# Patient Record
Sex: Female | Born: 1990 | Race: White | Hispanic: No | Marital: Single | Smoking: Former smoker
Health system: Southern US, Community
[De-identification: ages and names within clinical notes are randomized; demographics above are authoritative.]

## PROBLEM LIST (undated history)

## (undated) DIAGNOSIS — F32A Depression, unspecified: Secondary | ICD-10-CM

## (undated) DIAGNOSIS — F419 Anxiety disorder, unspecified: Secondary | ICD-10-CM

## (undated) DIAGNOSIS — F329 Major depressive disorder, single episode, unspecified: Secondary | ICD-10-CM

---

## 2015-02-28 ENCOUNTER — Emergency Department (HOSPITAL_COMMUNITY)
Admission: EM | Admit: 2015-02-28 | Discharge: 2015-02-28 | Disposition: A | Discharge status: Home / Self Care | Payer: Worker's Compensation | Attending: Emergency Medicine | Admitting: Emergency Medicine

## 2015-02-28 ENCOUNTER — Encounter (HOSPITAL_COMMUNITY): Payer: Self-pay | Admitting: Family Medicine

## 2015-02-28 DIAGNOSIS — S3991XA Unspecified injury of abdomen, initial encounter: Secondary | ICD-10-CM | POA: Diagnosis not present

## 2015-02-28 DIAGNOSIS — Y998 Other external cause status: Secondary | ICD-10-CM | POA: Insufficient documentation

## 2015-02-28 DIAGNOSIS — W19XXXA Unspecified fall, initial encounter: Secondary | ICD-10-CM

## 2015-02-28 DIAGNOSIS — S4991XA Unspecified injury of right shoulder and upper arm, initial encounter: Secondary | ICD-10-CM | POA: Diagnosis not present

## 2015-02-28 DIAGNOSIS — W1839XA Other fall on same level, initial encounter: Secondary | ICD-10-CM | POA: Diagnosis not present

## 2015-02-28 DIAGNOSIS — Y9289 Other specified places as the place of occurrence of the external cause: Secondary | ICD-10-CM | POA: Diagnosis not present

## 2015-02-28 DIAGNOSIS — R000 Tachycardia, unspecified: Secondary | ICD-10-CM | POA: Insufficient documentation

## 2015-02-28 DIAGNOSIS — Y9389 Activity, other specified: Secondary | ICD-10-CM | POA: Insufficient documentation

## 2015-02-28 DIAGNOSIS — R1012 Left upper quadrant pain: Secondary | ICD-10-CM

## 2015-02-28 DIAGNOSIS — Z87891 Personal history of nicotine dependence: Secondary | ICD-10-CM | POA: Diagnosis not present

## 2015-02-28 DIAGNOSIS — M25511 Pain in right shoulder: Secondary | ICD-10-CM

## 2015-02-28 DIAGNOSIS — M7918 Myalgia, other site: Secondary | ICD-10-CM

## 2015-02-28 LAB — URINALYSIS, ROUTINE W REFLEX MICROSCOPIC
BILIRUBIN URINE: NEGATIVE
Glucose, UA: NEGATIVE mg/dL
Hgb urine dipstick: NEGATIVE
KETONES UR: 15 mg/dL — AB
LEUKOCYTES UA: NEGATIVE
Nitrite: NEGATIVE
Protein, ur: NEGATIVE mg/dL
Specific Gravity, Urine: 1.029 (ref 1.005–1.030)
UROBILINOGEN UA: 0.2 mg/dL (ref 0.0–1.0)
pH: 5.5 (ref 5.0–8.0)

## 2015-02-28 LAB — RAPID URINE DRUG SCREEN, HOSP PERFORMED
Amphetamines: NOT DETECTED
BENZODIAZEPINES: NOT DETECTED
Barbiturates: NOT DETECTED
Cocaine: NOT DETECTED
Opiates: NOT DETECTED
TETRAHYDROCANNABINOL: NOT DETECTED

## 2015-02-28 MED ORDER — KETOROLAC TROMETHAMINE 60 MG/2ML IM SOLN
60.0000 mg | Freq: Once | INTRAMUSCULAR | Status: AC
  Administered 2015-02-28: 60 mg via INTRAMUSCULAR
  Filled 2015-02-28: qty 2

## 2015-02-28 MED ORDER — CYCLOBENZAPRINE HCL 10 MG PO TABS: 10.0000 mg | ORAL_TABLET | Freq: Two times a day (BID) | ORAL | Status: DC | PRN

## 2015-02-28 MED ORDER — ONDANSETRON 4 MG PO TBDP
8.0000 mg | ORAL_TABLET | Freq: Once | ORAL | Status: AC
  Administered 2015-02-28: 8 mg via ORAL
  Filled 2015-02-28: qty 2

## 2015-02-28 MED ORDER — IBUPROFEN 800 MG PO TABS: 800.0000 mg | ORAL_TABLET | Freq: Three times a day (TID) | ORAL | Status: DC

## 2015-02-28 MED ORDER — ONDANSETRON HCL 4 MG PO TABS: 4.0000 mg | ORAL_TABLET | Freq: Three times a day (TID) | ORAL | Status: DC | PRN

## 2015-02-28 NOTE — ED Notes (Signed)
I gave the patient a cup of ice and a ginger-ale. 

## 2015-02-28 NOTE — Discharge Instructions (Signed)
Shoulder Pain  The shoulder is the joint that connects your arm to your body. Muscles and band-like tissues that connect bones to muscles (tendons) hold the joint together. Shoulder pain is felt if an injury or medical problem affects one or more parts of the shoulder.  HOME CARE   · Put ice on the sore area.  ¨ Put ice in a plastic bag.  ¨ Place a towel between your skin and the bag.  ¨ Leave the ice on for 15-20 minutes, 03-04 times a day for the first 2 days.  · Stop using cold packs if they do not help with the pain.  · If you were given something to keep your shoulder from moving (sling; shoulder immobilizer), wear it as told. Only take it off to shower or bathe.  · Move your arm as little as possible, but keep your hand moving to prevent puffiness (swelling).  · Squeeze a soft ball or foam pad as much as possible to help prevent swelling.  · Take medicine as told by your doctor.  GET HELP IF:  · You have progressing new pain in your arm, hand, or fingers.  · Your hand or fingers get cold.  · Your medicine does not help lessen your pain.  GET HELP RIGHT AWAY IF:   · Your arm, hand, or fingers are numb or tingling.  · Your arm, hand, or fingers are puffy (swollen), painful, or turn white or blue.  MAKE SURE YOU:   · Understand these instructions.  · Will watch your condition.  · Will get help right away if you are not doing well or get worse.  Document Released: 01/14/2008 Document Revised: 12/12/2013 Document Reviewed: 02/09/2012  Exit[employer]® Patient Information ©2015 [employer]. This information is not intended to replace advice given to you by your health care provider. Make sure you discuss any questions you have with your health care provider.

## 2015-02-28 NOTE — ED Provider Notes (Signed)
CSN: [id number]     Arrival date & time 02/28/15  1700 History  This chart was scribed for Danelle Berry, PA-C, working with Arby Barrette, MD by Chestine Spore, ED Scribe. The patient was seen in room TR06C/TR06C at 5:39 PM.    Chief Complaint  Patient presents with  . Fall  . Abdominal Pain      The history is provided by the patient. No language interpreter was used.    HPI Comments: Belinda Solis is a 24 y.o. female who presents to the Emergency Department complaining of fall onset 3:20 PM. Pt reports that she was a work and attempted to sit in a chair when the chair collapsed, pt reports that the chair was already broken when she attempted to sit down. Pt notes that she caught her herself by leaning forward and twisting her upper portion of her body. Pt denies hitting her abdomen at the time, but that she gasped and sucked in her abdomen and strained a muscle. Pt went to her pain management doctor who she sees for her ruptured discs who informed her to come to the ED. Pt took her percocet about 5 minutes before the incident occurred. Pt stopped taking her stimulant medicines for a week because her HR was racing. Pt rates her abdomen pain as 8/10 and nothing particularly worsens her right shoulder pain. She states that she is having associated symptoms of severe left upper abdominal pain, nausea due to pain, and right shoulder pain. She denies vomiting, LOC, hitting her head, neck pain, neck stiffness, diarrhea, SOB, CP, lightheadedness, numbness, tingling, and any other symptoms.     History reviewed. No pertinent past medical history. History reviewed. No pertinent past surgical history. History reviewed. No pertinent family history. History  Substance Use Topics  . Smoking status: Former Games developer  . Smokeless tobacco: Not on file  . Alcohol Use: Yes     Comment: occ   OB History    No data available     Review of Systems  Respiratory: Negative for shortness of breath.    Cardiovascular: Negative for chest pain.  Gastrointestinal: Positive for nausea and abdominal pain. Negative for vomiting and diarrhea.  Musculoskeletal: Positive for arthralgias (right shoulder).  Skin: Negative for color change, rash and wound.  Neurological: Negative for syncope, light-headedness and numbness.       No tingling      Allergies  Review of patient's allergies indicates no known allergies.  Home Medications   Prior to Admission medications   Not on File   BP 135/90 mmHg  Pulse 114  Temp(Src) 97.7 F (36.5 C) (Oral)  Resp 16  SpO2 97%  LMP 02/28/2015   Physical Exam  Constitutional: She is oriented to person, place, and time. She appears well-developed and well-nourished. No distress.  HENT:  Head: Normocephalic and atraumatic.  Right Ear: External ear normal.  Left Ear: External ear normal.  Nose: Nose normal.  Mouth/Throat: Oropharynx is clear and moist. No oropharyngeal exudate.  Eyes: Conjunctivae and EOM are normal. Pupils are equal, round, and reactive to light. Right eye exhibits no discharge. Left eye exhibits no discharge. No scleral icterus.  Neck: Normal range of motion. Neck supple. No JVD present. No tracheal deviation present.  Cardiovascular: Regular rhythm.  Tachycardia present.  Exam reveals no gallop and no friction rub.   No murmur heard. Mild tachycardia  Pulmonary/Chest: Effort normal and breath sounds normal. No stridor. No respiratory distress. She has no wheezes. She has no rales.  Abdominal: Soft. Bowel sounds are normal. She exhibits no distension. There is tenderness in the epigastric area and left upper quadrant. There is no rebound and no guarding.  Tender to light palpation of the epigastric to LUQ.  Musculoskeletal: Normal range of motion. She exhibits no edema.  Right shoulder:   Lymphadenopathy:    She has no cervical adenopathy.  Neurological: She is alert and oriented to person, place, and time. She exhibits normal  muscle tone. Coordination normal.  Skin: Skin is warm and dry. No rash noted. She is not diaphoretic. No erythema. No pallor.  Psychiatric: She has a normal mood and affect. Her behavior is normal. Judgment and thought content normal.  Nursing note and vitals reviewed.   ED Course  Procedures (including critical care time) DIAGNOSTIC STUDIES: Oxygen Saturation is 97% on RA, nl by my interpretation.    COORDINATION OF CARE: 5:50 PM-Discussed treatment plan which includes RICE, ibuprofen, muscle relaxer Rx with pt at bedside and pt agreed to plan.   Labs Review Labs Reviewed  URINALYSIS, ROUTINE W REFLEX MICROSCOPIC (NOT AT [facility]) - Abnormal; Notable for the following:    Ketones, ur 15 (*)    All other components within normal limits  URINE RAPID DRUG SCREEN, HOSP PERFORMED    Imaging Review No results found.   EKG Interpretation None      MDM   Final diagnoses:  None    Patient with fall with a twisting motion and pain in her left abdominal muscles in soreness in her right shoulder - no visible signs of trauma.  Patient appears anxious, states that her nausea, tachycardia and hyperventilation is all from her anxiety, she is diaphoretic in the exam room, normal physical exam, except for tenderness to left abdominal muscles and mild tenderness over anterior deltoid.  Do not believe any x-rays are indicated at this time. Did not believe any CT of the abdomen is indicated, as she has no guarding, no rebound and no peritoneal signs, normal bowel sounds, normal lung sounds and no tenderness over ribs or spine. Plan is for patient to discharge home with use of chronic narcotic pain medicine, with addition of NSAIDs and muscle relaxers and work note.  Patient had one episode of vomiting while in the ER.  Patient will be given Zofran and reevaluated.     Pt feels better with zofran, she sts vomiting/nausea is related to anxiety and pain.   PO trial - if successful, will d/c pt home,  with zofran added to rx   Patient was discussed with Dr. Donnald GarrePfeiffer, who suggested orthostatics, urine with drug screen and Valium for possible withdrawal.  The patient was updated with plan, has gotten a ride home, and is in agreement with Valium treatment.  Urine drug screen was negative, patient states she feels improved with Valium, no evidence of tremor, sweats patient is not tachycardic. Her ride arrived, vitals are stable and patient was discharged home.  Filed Vitals:   02/28/15 1706 02/28/15 1937 02/28/15 2000  BP: 135/90 119/83   Pulse: 114 82   Temp: 97.7 F (36.5 C)  98.3 F (36.8 C)  TempSrc: Oral  Oral  Resp: 16 16   SpO2: 97% 99%    Medications  ondansetron (ZOFRAN-ODT) disintegrating tablet 8 mg (8 mg Oral Given 02/28/15 1811)  ketorolac (TORADOL) injection 60 mg (60 mg Intramuscular Given 02/28/15 1853)     I personally performed the services described in this documentation, which was scribed in my presence. The recorded  information has been reviewed and is accurate.    Danelle Berry, PA-C 03/02/15 1610  Arby Barrette, MD 03/11/15 [phone]

## 2015-02-28 NOTE — ED Notes (Addendum)
Pt sts she was at work and went to sit in a seat. sts the seat was coming off and she twisted her upper abd area trying to catch her fall. sts severe upper abd pain and nausea. sts also right shoulder pain

## 2015-03-03 ENCOUNTER — Emergency Department (HOSPITAL_COMMUNITY): Payer: Worker's Compensation

## 2015-03-03 ENCOUNTER — Encounter (HOSPITAL_COMMUNITY): Payer: Self-pay | Admitting: *Deleted

## 2015-03-03 ENCOUNTER — Emergency Department (HOSPITAL_COMMUNITY)
Admission: EM | Admit: 2015-03-03 | Discharge: 2015-03-03 | Disposition: A | Discharge status: Home / Self Care | Payer: Worker's Compensation | Attending: Emergency Medicine | Admitting: Emergency Medicine

## 2015-03-03 DIAGNOSIS — G8929 Other chronic pain: Secondary | ICD-10-CM | POA: Insufficient documentation

## 2015-03-03 DIAGNOSIS — M549 Dorsalgia, unspecified: Secondary | ICD-10-CM | POA: Insufficient documentation

## 2015-03-03 DIAGNOSIS — M25511 Pain in right shoulder: Secondary | ICD-10-CM | POA: Insufficient documentation

## 2015-03-03 DIAGNOSIS — Z791 Long term (current) use of non-steroidal anti-inflammatories (NSAID): Secondary | ICD-10-CM | POA: Diagnosis not present

## 2015-03-03 DIAGNOSIS — S161XXD Strain of muscle, fascia and tendon at neck level, subsequent encounter: Secondary | ICD-10-CM | POA: Diagnosis not present

## 2015-03-03 DIAGNOSIS — Z87891 Personal history of nicotine dependence: Secondary | ICD-10-CM | POA: Diagnosis not present

## 2015-03-03 DIAGNOSIS — W1839XD Other fall on same level, subsequent encounter: Secondary | ICD-10-CM | POA: Diagnosis not present

## 2015-03-03 DIAGNOSIS — M542 Cervicalgia: Secondary | ICD-10-CM | POA: Diagnosis present

## 2015-03-03 NOTE — ED Notes (Signed)
Pt is in stable condition upon d/c and ambulates from ED. 

## 2015-03-03 NOTE — Discharge Instructions (Signed)
Your x-ray today shows no fracture. You should see the company doctor to decide if you need physical therapy or orthopedic referral.   Cervical Sprain A cervical sprain is when the tissues (ligaments) that hold the neck bones in place stretch or tear. HOME CARE   Put ice on the injured area.  Put ice in a plastic bag.  Place a towel between your skin and the bag.  Leave the ice on for 15-20 minutes, 3-4 times a day.  You may have been given a collar to wear. This collar keeps your neck from moving while you heal.  Do not take the collar off unless told by your doctor.  If you have long hair, keep it outside of the collar.  Ask your doctor before changing the position of your collar. You may need to change its position over time to make it more comfortable.  If you are allowed to take off the collar for cleaning or bathing, follow your doctor's instructions on how to do it safely.  Keep your collar clean by wiping it with mild soap and water. Dry it completely. If the collar has removable pads, remove them every 1-2 days to hand wash them with soap and water. Allow them to air dry. They should be dry before you wear them in the collar.  Do not drive while wearing the collar.  Only take medicine as told by your doctor.  Keep all doctor visits as told.  Keep all physical therapy visits as told.  Adjust your work station so that you have good posture while you work.  Avoid positions and activities that make your problems worse.  Warm up and stretch before being active. GET HELP IF:  Your pain is not controlled with medicine.  You cannot take less pain medicine over time as planned.  Your activity level does not improve as expected. GET HELP RIGHT AWAY IF:   You are bleeding.  Your stomach is upset.  You have an allergic reaction to your medicine.  You develop new problems that you cannot explain.  You lose feeling (become numb) or you cannot move any part of your  body (paralysis).  You have tingling or weakness in any part of your body.  Your symptoms get worse. Symptoms include:  Pain, soreness, stiffness, puffiness (swelling), or a burning feeling in your neck.  Pain when your neck is touched.  Shoulder or upper back pain.  Limited ability to move your neck.  Headache.  Dizziness.  Your hands or arms feel week, lose feeling, or tingle.  Muscle spasms.  Difficulty swallowing or chewing. MAKE SURE YOU:   Understand these instructions.  Will watch your condition.  Will get help right away if you are not doing well or get worse. Document Released: 01/14/2008 Document Revised: 03/30/2013 Document Reviewed: 02/02/2013 [facility] Patient Information 2015 [place], [place]. This information is not intended to replace advice given to you by your health care provider. Make sure you discuss any questions you have with your health care provider.

## 2015-03-03 NOTE — ED Provider Notes (Signed)
CSN: [id number]     Arrival date & time 03/03/15  1543 History  This chart was scribed for non-physician practitioner Kerrie Buffalo, NP working with Arby Barrette, MD by Lyndel Safe, ED Scribe. This patient was seen in room TR09C/TR09C and the patient's care was started at 4:44 PM.    Chief Complaint  Patient presents with  . Neck Pain  . Shoulder Pain   Patient is a 24 y.o. female presenting with neck injury. The history is provided by the patient. No language interpreter was used.  Neck Injury This is a new (right-sided neck pain that radiates to right shoulder) problem. The current episode started more than 2 days ago. The problem occurs constantly. The problem has been gradually worsening. Pertinent negatives include no abdominal pain. The symptoms are aggravated by bending. Nothing relieves the symptoms. Treatments tried: Percocet, ibuprofen, and flexeril. The treatment provided no relief.   HPI Comments: Belinda Solis is a 24 y.o. female, with a PMhx of chronic back pain, who presents to the Emergency Department complaining of gradually worsening, constant, burning right-sided neck pain that radiates to her right shoulder s/p fall that occurred 3 days ago. She states she can feel her right shoulder pop with abduction. Pt reports with the onset of her abdominal pain that followed the fall 3 days ago she did not feel the neck or shoulder pain. Pt was seen 3 days ago in the ED s/p fall complaining of abdominal pain and nausea. Pt was discharged home with instructions to take her previously prescribed chronic narcotic pain medication and 800mg  ibuprofen and Flexeril prescribed in the ED. She states her shoulder pain is prohibiting her driving ability and she has therefore been unable to work. Pt is followed by a pain management doctor who prescribed percocet for her ruptured disc pain. 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 Denies back pain, nausea, or  abdominal pain.    History reviewed. No pertinent past medical history. History reviewed. No pertinent past surgical history. History reviewed. No pertinent family history. History  Substance Use Topics  . Smoking status: Former Games developer  . Smokeless tobacco: Not on file  . Alcohol Use: Yes     Comment: occ   OB History    No data available     Review of Systems  Gastrointestinal: Negative for nausea and abdominal pain.  Musculoskeletal: Positive for back pain (chronic), arthralgias and neck pain. Negative for gait problem.  All other systems reviewed and are negative.  Allergies  Review of patient's allergies indicates no known allergies.  Home Medications   Prior to Admission medications   Medication Sig Start Date End Date Taking? Authorizing Provider  cyclobenzaprine (FLEXERIL) 10 MG tablet Take 1 tablet (10 mg total) by mouth 2 (two) times daily as needed for muscle spasms. 02/28/15   Danelle Berry, PA-C  ibuprofen (ADVIL,MOTRIN) 800 MG tablet Take 1 tablet (800 mg total) by mouth 3 (three) times daily. 02/28/15   Danelle Berry, PA-C  ondansetron (ZOFRAN) 4 MG tablet Take 1 tablet (4 mg total) by mouth every 8 (eight) hours as needed for nausea or vomiting. 02/28/15   Danelle Berry, PA-C   BP 125/71 mmHg  Pulse 108  Temp(Src) 98 F (36.7 C) (Oral)  Resp 22  SpO2 99%  LMP 02/28/2015 Physical Exam  Constitutional: She is oriented to person, place, and time. She appears well-developed and well-nourished.  HENT:  Head: Normocephalic.  Eyes: EOM are normal.  Neck:  Normal range of motion. Neck supple.  Cardiovascular: Normal rate, regular rhythm and normal heart sounds.   Radial pulses 2+ bilaterally.   Pulmonary/Chest: Effort normal and breath sounds normal. No respiratory distress. She has no wheezes.  Abdominal: Soft. There is no tenderness.  Musculoskeletal: Normal range of motion.  FROM in right shoulder.   Neurological: She is alert and oriented to person, place, and  time. She has normal strength. No cranial nerve deficit or sensory deficit.  Reflex Scores:      Bicep reflexes are 2+ on the right side and 2+ on the left side.      Brachioradialis reflexes are 2+ on the right side and 2+ on the left side. Equal grips bilaterally.   Skin: Skin is warm and dry.  Psychiatric: She has a normal mood and affect. Her behavior is normal.  Nursing note and vitals reviewed.   ED Course  Procedures  DIAGNOSTIC STUDIES: Oxygen Saturation is 99% on RA, normal by my interpretation.    COORDINATION OF CARE: 4:50 PM Discussed treatment plan which includes to order Xray of cervical spine with pt. Pt acknowledges and agrees to plan.   Labs Review Labs Reviewed - No data to display  Imaging Review Dg Cervical Spine Complete  03/03/2015   CLINICAL DATA:  Fall off chair, landing on concrete on right side of arm and shoulder. Right-sided neck pain. Initial encounter.  EXAM: CERVICAL SPINE  4+ VIEWS  COMPARISON:  None.  FINDINGS: There is mild straightening of the normal cervical lordosis, which may be positional or due to muscle spasm. There is no listhesis. Prevertebral soft tissues are within normal limits. Vertebral body heights and intervertebral disc space heights are preserved. No cervical spine fracture is identified. No lytic or blastic osseous lesion or radiopaque foreign body. Visualized lung apices are clear.  IMPRESSION: No acute osseous abnormality identified.   Electronically Signed   By: Sebastian Ache   On: 03/03/2015 17:40    MDM  24 y.o. female with continued pain to the cervical spine that radiates to the right shoulder s/p injury at work a few days ago. Stable for d/c without focal neuro deficits. She will follow up with the company doctor. She will return here as needed.  Final diagnoses:  Cervical strain, subsequent encounter   I personally performed the services described in this documentation, which was scribed in my presence. The recorded  information has been reviewed and is accurate.   [address] [place], [place] 03/04/15 4696  Arby Barrette, MD 03/11/15 [phone]

## 2015-03-03 NOTE — ED Notes (Signed)
Pt states she fell on 7/20 at work and was seen here. Pt states her abd pain has ceased, but her neck/shoulder pain has intensified. Pt states she was told to come back if her pain didn't cease.

## 2015-03-19 ENCOUNTER — Ambulatory Visit: Payer: Self-pay

## 2015-03-19 ENCOUNTER — Other Ambulatory Visit: Payer: Self-pay | Admitting: Occupational Medicine

## 2015-03-19 DIAGNOSIS — M25511 Pain in right shoulder: Secondary | ICD-10-CM

## 2015-04-12 ENCOUNTER — Other Ambulatory Visit: Payer: Self-pay | Admitting: Orthopedic Surgery

## 2015-04-12 DIAGNOSIS — M25511 Pain in right shoulder: Secondary | ICD-10-CM

## 2015-04-13 ENCOUNTER — Other Ambulatory Visit: Payer: Self-pay | Admitting: Orthopedic Surgery

## 2015-04-13 DIAGNOSIS — M25511 Pain in right shoulder: Secondary | ICD-10-CM

## 2015-04-13 DIAGNOSIS — M542 Cervicalgia: Secondary | ICD-10-CM

## 2015-04-24 ENCOUNTER — Other Ambulatory Visit: Payer: Self-pay

## 2015-05-02 ENCOUNTER — Other Ambulatory Visit: Payer: Self-pay

## 2015-06-15 ENCOUNTER — Emergency Department: Payer: 59

## 2015-06-15 ENCOUNTER — Emergency Department
Admission: EM | Admit: 2015-06-15 | Discharge: 2015-06-15 | Disposition: A | Discharge status: Home / Self Care | Payer: 59 | Attending: Emergency Medicine | Admitting: Emergency Medicine

## 2015-06-15 ENCOUNTER — Encounter: Payer: Self-pay | Admitting: *Deleted

## 2015-06-15 DIAGNOSIS — Z79899 Other long term (current) drug therapy: Secondary | ICD-10-CM | POA: Diagnosis not present

## 2015-06-15 DIAGNOSIS — Z87891 Personal history of nicotine dependence: Secondary | ICD-10-CM | POA: Insufficient documentation

## 2015-06-15 DIAGNOSIS — Z3202 Encounter for pregnancy test, result negative: Secondary | ICD-10-CM | POA: Diagnosis not present

## 2015-06-15 DIAGNOSIS — R109 Unspecified abdominal pain: Secondary | ICD-10-CM | POA: Insufficient documentation

## 2015-06-15 DIAGNOSIS — I880 Nonspecific mesenteric lymphadenitis: Secondary | ICD-10-CM | POA: Insufficient documentation

## 2015-06-15 LAB — POCT PREGNANCY, URINE: Preg Test, Ur: NEGATIVE

## 2015-06-15 LAB — COMPREHENSIVE METABOLIC PANEL
ALT: 79 U/L — ABNORMAL HIGH (ref 14–54)
AST: 65 U/L — AB (ref 15–41)
Albumin: 4 g/dL (ref 3.5–5.0)
Alkaline Phosphatase: 114 U/L (ref 38–126)
Anion gap: 9 (ref 5–15)
BILIRUBIN TOTAL: 0.2 mg/dL — AB (ref 0.3–1.2)
BUN: 12 mg/dL (ref 6–20)
CHLORIDE: 104 mmol/L (ref 101–111)
CO2: 26 mmol/L (ref 22–32)
Calcium: 9.5 mg/dL (ref 8.9–10.3)
Creatinine, Ser: 0.84 mg/dL (ref 0.44–1.00)
GFR calc Af Amer: >60 mL/min (ref >60)
GFR calc non Af Amer: >60 mL/min (ref >60)
GLUCOSE: 83 mg/dL (ref 65–99)
Potassium: 4.1 mmol/L (ref 3.5–5.1)
SODIUM: 139 mmol/L (ref 135–145)
TOTAL PROTEIN: 7.6 g/dL (ref 6.5–8.1)

## 2015-06-15 LAB — CBC
HEMATOCRIT: 44.4 % (ref 35.0–47.0)
Hemoglobin: 14.4 g/dL (ref 12.0–16.0)
MCH: 27.4 pg (ref 26.0–34.0)
MCHC: 32.4 g/dL (ref 32.0–36.0)
MCV: 84.6 fL (ref 80.0–100.0)
Platelets: 290 10*3/uL (ref 150–440)
RBC: 5.24 MIL/uL — ABNORMAL HIGH (ref 3.80–5.20)
RDW: 14.6 % — AB (ref 11.5–14.5)
WBC: 15.4 10*3/uL — AB (ref 3.6–11.0)

## 2015-06-15 LAB — URINALYSIS COMPLETE WITH MICROSCOPIC (ARMC ONLY)
Bilirubin Urine: NEGATIVE
Glucose, UA: NEGATIVE mg/dL
Ketones, ur: NEGATIVE mg/dL
LEUKOCYTES UA: NEGATIVE
Nitrite: NEGATIVE
PH: 7 (ref 5.0–8.0)
PROTEIN: NEGATIVE mg/dL
RBC / HPF: NONE SEEN RBC/hpf (ref 0–5)
SPECIFIC GRAVITY, URINE: 1.006 (ref 1.005–1.030)

## 2015-06-15 LAB — LIPASE, BLOOD: Lipase: 34 U/L (ref 11–51)

## 2015-06-15 MED ORDER — ONDANSETRON HCL 4 MG/2ML IJ SOLN
4.0000 mg | Freq: Once | INTRAMUSCULAR | Status: AC
  Administered 2015-06-15: 4 mg via INTRAVENOUS
  Filled 2015-06-15: qty 2

## 2015-06-15 MED ORDER — TRAMADOL HCL 50 MG PO TABS: 50.0000 mg | ORAL_TABLET | Freq: Four times a day (QID) | ORAL | Status: AC | PRN

## 2015-06-15 MED ORDER — SODIUM CHLORIDE 0.9 % IV SOLN
1000.0000 mL | Freq: Once | INTRAVENOUS | Status: AC
  Administered 2015-06-15: 1000 mL via INTRAVENOUS

## 2015-06-15 MED ORDER — ONDANSETRON HCL 4 MG PO TABS: 4.0000 mg | ORAL_TABLET | Freq: Every day | ORAL | Status: AC | PRN

## 2015-06-15 MED ORDER — KETOROLAC TROMETHAMINE 30 MG/ML IJ SOLN
30.0000 mg | Freq: Once | INTRAMUSCULAR | Status: AC
  Administered 2015-06-15: 30 mg via INTRAVENOUS
  Filled 2015-06-15: qty 1

## 2015-06-15 NOTE — ED Provider Notes (Signed)
[facility] Emergency Department Provider Note  ____________________________________________  Time seen: 5 PM  I have reviewed the triage vital signs and the nursing notes.   HISTORY  Chief Complaint Flank Pain    HPI Belinda Solis is a 24 y.o. female who presents with complaints of abrupt onset of moderate to severe right flank pain. She reports the pain is intermittent and has caused nausea and vomiting with this particular severe. She reports it is stabbing in nature. She denies fevers chills. She has never had this pain before. She denies dysuria. She denies frequency. She has no history of kidney stones. No recent travel. She has not tried anything for the pain.     History reviewed. No pertinent past medical history.  There are no active problems to display for this patient.   History reviewed. No pertinent past surgical history.  Current Outpatient Rx  Name  Route  Sig  Dispense  Refill  . busPIRone (BUSPAR) 10 MG tablet   Oral   Take 10 mg by mouth 3 (three) times daily.         . citalopram (CELEXA) 10 MG tablet   Oral   Take 10 mg by mouth daily.           Allergies Review of patient's allergies indicates no known allergies.  History reviewed. No pertinent family history.  Social History Social History  Substance Use Topics  . Smoking status: Former Games developermoker  . Smokeless tobacco: None  . Alcohol Use: Yes     Comment: occ    Review of Systems  Constitutional: Negative for fever. Eyes: Negative for visual changes. ENT: Negative for sore throat Cardiovascular: Negative for chest pain. Respiratory: Negative for shortness of breath. Gastrointestinal: Positive for flank pain Genitourinary: Negative for dysuria. Musculoskeletal: Negative for back pain. Skin: Negative for rash. Neurological: Negative for headaches or focal weakness Psychiatric: No anxiety    ____________________________________________   PHYSICAL  EXAM:  VITAL SIGNS: ED Triage Vitals  Enc Vitals Group     BP 06/15/15 1612 123/104 mmHg     Pulse Rate 06/15/15 1612 116     Resp 06/15/15 1612 18     Temp 06/15/15 1612 97.6 F (36.4 C)     Temp Source 06/15/15 1612 Oral     SpO2 06/15/15 1612 99 %     Weight 06/15/15 1612 165 lb (74.844 kg)     Height 06/15/15 1612 5\' 5"  (1.651 m)     Head Cir --      Peak Flow --      Pain Score 06/15/15 1610 6     Pain Loc --      Pain Edu? --      Excl. in GC? --      Constitutional: Alert and oriented. Well appearing and in no distress. Eyes: Conjunctivae are normal.  ENT   Head: Normocephalic and atraumatic.   Mouth/Throat: Mucous membranes are moist. Cardiovascular: Normal rate, regular rhythm. Normal and symmetric distal pulses are present in all extremities. No murmurs, rubs, or gallops. Respiratory: Normal respiratory effort without tachypnea nor retractions. Breath sounds are clear and equal bilaterally.  Gastrointestinal: Soft and non-tender in all quadrants. No distention. There is no CVA tenderness. Genitourinary: deferred Musculoskeletal: Nontender with normal range of motion in all extremities. No lower extremity tenderness nor edema. Neurologic:  Normal speech and language. No gross focal neurologic deficits are appreciated. Skin:  Skin is warm, dry and intact. No rash noted. Psychiatric: Mood and  affect are normal. Patient exhibits appropriate insight and judgment.  ____________________________________________    LABS (pertinent positives/negatives)  Labs Reviewed  URINALYSIS COMPLETEWITH MICROSCOPIC ([facility] ONLY) - Abnormal; Notable for the following:    Color, Urine YELLOW (*)    APPearance CLEAR (*)    Hgb urine dipstick 1+ (*)    Bacteria, UA RARE (*)    Squamous Epithelial / LPF 0-5 (*)    All other components within normal limits  CBC - Abnormal; Notable for the following:    WBC 15.4 (*)    RBC 5.24 (*)    RDW 14.6 (*)    All other components  within normal limits  COMPREHENSIVE METABOLIC PANEL - Abnormal; Notable for the following:    AST 65 (*)    ALT 79 (*)    Total Bilirubin 0.2 (*)    All other components within normal limits  LIPASE, BLOOD  POC URINE PREG, ED  POCT PREGNANCY, URINE    ____________________________________________   EKG  None  ____________________________________________    RADIOLOGY I have personally reviewed any xrays that were ordered on this patient: CT abdomen pelvis shows no kidney stone, there is some evidence for mesenteric adenitis  ____________________________________________   PROCEDURES  Procedure(s) performed: none  Critical Care performed: none  ____________________________________________   INITIAL IMPRESSION / ASSESSMENT AND PLAN / ED COURSE  Pertinent labs & imaging results that were available during my care of the patient were reviewed by me and considered in my medical decision making (see chart for details).  Patient presents with complaints of abrupt onset right flank pain. She does have blood in her urine which is suspicious for kidney stone. We will give Toradol 30 mg IV, obtain renal stone study and reevaluate  CT scan unremarkable except for some enlarged lymph nodes suspicious for mesenteric adenitis. Patient feeling significant better after Toradol. Her heart rate is improved. Her only abnormality on lab work is elevated white blood cell count and a very mild elevation in AST and ALT. She has no tenderness to palpation in her right upper quadrant. I have asked her to follow up closely with her PCP. In addition I have asked her to return to the emergency department if her pain worsens or she develops fevers chills nausea or vomiting. The patient agrees   ____________________________________________   FINAL CLINICAL IMPRESSION(S) / ED DIAGNOSES  Final diagnoses:  Flank pain, acute  Mesenteric adenitis     Jene Every, MD 06/15/15 2008

## 2015-06-15 NOTE — ED Notes (Signed)
Right sided intermittent flank pain with N/V x 1 day.

## 2015-06-18 ENCOUNTER — Encounter: Payer: Self-pay | Admitting: Emergency Medicine

## 2015-06-18 ENCOUNTER — Emergency Department
Admission: EM | Admit: 2015-06-18 | Discharge: 2015-06-19 | Disposition: A | Discharge status: Home / Self Care | Payer: 59 | Attending: Emergency Medicine | Admitting: Emergency Medicine

## 2015-06-18 DIAGNOSIS — Z79899 Other long term (current) drug therapy: Secondary | ICD-10-CM | POA: Diagnosis not present

## 2015-06-18 DIAGNOSIS — Z3202 Encounter for pregnancy test, result negative: Secondary | ICD-10-CM | POA: Insufficient documentation

## 2015-06-18 DIAGNOSIS — K5900 Constipation, unspecified: Secondary | ICD-10-CM | POA: Diagnosis not present

## 2015-06-18 DIAGNOSIS — R1084 Generalized abdominal pain: Secondary | ICD-10-CM

## 2015-06-18 DIAGNOSIS — K567 Ileus, unspecified: Secondary | ICD-10-CM | POA: Insufficient documentation

## 2015-06-18 DIAGNOSIS — R1030 Lower abdominal pain, unspecified: Secondary | ICD-10-CM | POA: Diagnosis present

## 2015-06-18 DIAGNOSIS — R112 Nausea with vomiting, unspecified: Secondary | ICD-10-CM | POA: Insufficient documentation

## 2015-06-18 DIAGNOSIS — Z87891 Personal history of nicotine dependence: Secondary | ICD-10-CM | POA: Diagnosis not present

## 2015-06-18 HISTORY — DX: Anxiety disorder, unspecified: F41.9

## 2015-06-18 HISTORY — DX: Depression, unspecified: F32.A

## 2015-06-18 HISTORY — DX: Major depressive disorder, single episode, unspecified: F32.9

## 2015-06-18 LAB — COMPREHENSIVE METABOLIC PANEL
ALK PHOS: 114 U/L (ref 38–126)
ALT: 72 U/L — AB (ref 14–54)
AST: 60 U/L — AB (ref 15–41)
Albumin: 3.9 g/dL (ref 3.5–5.0)
Anion gap: 10 (ref 5–15)
BUN: 8 mg/dL (ref 6–20)
CALCIUM: 8.9 mg/dL (ref 8.9–10.3)
CO2: 25 mmol/L (ref 22–32)
CREATININE: 0.78 mg/dL (ref 0.44–1.00)
Chloride: 102 mmol/L (ref 101–111)
GFR calc non Af Amer: >60 mL/min (ref >60)
Glucose, Bld: 93 mg/dL (ref 65–99)
Potassium: 4 mmol/L (ref 3.5–5.1)
SODIUM: 137 mmol/L (ref 135–145)
Total Bilirubin: 0.2 mg/dL — ABNORMAL LOW (ref 0.3–1.2)
Total Protein: 7.3 g/dL (ref 6.5–8.1)

## 2015-06-18 LAB — URINALYSIS COMPLETE WITH MICROSCOPIC (ARMC ONLY)
BILIRUBIN URINE: NEGATIVE
Glucose, UA: NEGATIVE mg/dL
Hgb urine dipstick: NEGATIVE
KETONES UR: NEGATIVE mg/dL
LEUKOCYTES UA: NEGATIVE
Nitrite: NEGATIVE
PROTEIN: NEGATIVE mg/dL
RBC / HPF: NONE SEEN RBC/hpf (ref 0–5)
SPECIFIC GRAVITY, URINE: 1.005 (ref 1.005–1.030)
pH: 6 (ref 5.0–8.0)

## 2015-06-18 LAB — LIPASE, BLOOD: Lipase: 30 U/L (ref 11–51)

## 2015-06-18 LAB — POCT PREGNANCY, URINE: Preg Test, Ur: NEGATIVE

## 2015-06-18 NOTE — ED Notes (Signed)
Seen on Friday in ED for c/o flank pain, told to return if things worsen.  Today patient states still vomiting after eating, c/o abdominal bloating and constipation.  Last BM 11/2

## 2015-06-19 ENCOUNTER — Emergency Department: Payer: 59

## 2015-06-19 LAB — CBC WITH DIFFERENTIAL/PLATELET
BASOS PCT: 1 %
Basophils Absolute: 0.1 10*3/uL (ref 0–0.1)
EOS ABS: 0.5 10*3/uL (ref 0–0.7)
EOS PCT: 4 %
HCT: 43.8 % (ref 35.0–47.0)
HEMOGLOBIN: 14.3 g/dL (ref 12.0–16.0)
Lymphocytes Relative: 30 %
Lymphs Abs: 4 10*3/uL — ABNORMAL HIGH (ref 1.0–3.6)
MCH: 27.7 pg (ref 26.0–34.0)
MCHC: 32.7 g/dL (ref 32.0–36.0)
MCV: 84.8 fL (ref 80.0–100.0)
Monocytes Absolute: 1.1 10*3/uL — ABNORMAL HIGH (ref 0.2–0.9)
Monocytes Relative: 9 %
NEUTROS PCT: 56 %
Neutro Abs: 7.5 10*3/uL — ABNORMAL HIGH (ref 1.4–6.5)
PLATELETS: 296 10*3/uL (ref 150–440)
RBC: 5.17 MIL/uL (ref 3.80–5.20)
RDW: 14.8 % — ABNORMAL HIGH (ref 11.5–14.5)
WBC: 13.2 10*3/uL — AB (ref 3.6–11.0)

## 2015-06-19 MED ORDER — ONDANSETRON HCL 4 MG/2ML IJ SOLN
4.0000 mg | Freq: Once | INTRAMUSCULAR | Status: AC
  Administered 2015-06-19: 4 mg via INTRAVENOUS
  Filled 2015-06-19: qty 2

## 2015-06-19 MED ORDER — LACTULOSE 10 GM/15ML PO SOLN
30.0000 g | Freq: Once | ORAL | Status: AC
  Administered 2015-06-19: 30 g via ORAL
  Filled 2015-06-19: qty 60

## 2015-06-19 MED ORDER — SODIUM CHLORIDE 0.9 % IV BOLUS (SEPSIS)
1000.0000 mL | Freq: Once | INTRAVENOUS | Status: AC
  Administered 2015-06-19: 1000 mL via INTRAVENOUS

## 2015-06-19 MED ORDER — LACTULOSE 10 GM/15ML PO SOLN: 20.0000 g | Freq: Every day | ORAL | Status: AC | PRN

## 2015-06-19 MED ORDER — POLYETHYLENE GLYCOL 3350 17 GM/SCOOP PO POWD: ORAL | Status: AC

## 2015-06-19 NOTE — ED Provider Notes (Signed)
[facility] Emergency Department Provider Note  ____________________________________________  Time seen: Approximately 12:21 AM  I have reviewed the triage vital signs and the nursing notes.   HISTORY  Chief Complaint Abdominal Pain    HPI Belinda Solis is a 24 y.o. female who presents to the ED from home with a chief complaint of lower abdominal pain, constipation and vomiting. Patient was seen 3 days ago in the ED for right flank pain, had a CT scan suspicious for mesenteric adenitis and discharged home. Patient reports she has chronic issues with constipation; has had no bowel movement 6 days until just now when she was able to produce some small, hard stool balls in her treatment room. Complains of mainly lower abdominal pain, bloating, nausea/vomiting. States yesterday she had some difficulty urinating but not today.Denies recent fever, chills, chest pain, shortness of breath, diarrhea, vaginal bleeding or discharge. Denies recent travel or trauma.   Past Medical History  Diagnosis Date  . Anxiety   . Depression   No history of ovarian cysts or endometriosis  There are no active problems to display for this patient.   History reviewed. No pertinent past surgical history.  Current Outpatient Rx  Name  Route  Sig  Dispense  Refill  . busPIRone (BUSPAR) 10 MG tablet   Oral   Take 10 mg by mouth 3 (three) times daily.         . citalopram (CELEXA) 10 MG tablet   Oral   Take 10 mg by mouth daily.         . cyclobenzaprine (FLEXERIL) 10 MG tablet   Oral   Take 10 mg by mouth 2 (two) times daily.         . ondansetron (ZOFRAN) 4 MG tablet   Oral   Take 1 tablet (4 mg total) by mouth daily as needed for nausea or vomiting.   20 tablet   1   . oxyCODONE-acetaminophen (PERCOCET) 10-325 MG tablet   Oral   Take 1 tablet by mouth 2 (two) times daily as needed for pain.         . traMADol (ULTRAM) 50 MG tablet   Oral   Take 1 tablet (50  mg total) by mouth every 6 (six) hours as needed.   20 tablet   0     Allergies Review of patient's allergies indicates no known allergies.  No family history on file.  Social History Social History  Substance Use Topics  . Smoking status: Former Games developer  . Smokeless tobacco: None  . Alcohol Use: Yes     Comment: occ    Review of Systems Constitutional: No fever/chills Eyes: No visual changes. ENT: No sore throat. Cardiovascular: Denies chest pain. Respiratory: Denies shortness of breath. Gastrointestinal: Positive for abdominal pain.  Positive for nausea and vomiting.  No diarrhea.  Positive for constipation. Genitourinary: Negative for dysuria. Musculoskeletal: Negative for back pain. Skin: Negative for rash. Neurological: Negative for headaches, focal weakness or numbness.  10-point ROS otherwise negative.  ____________________________________________   PHYSICAL EXAM:  VITAL SIGNS: ED Triage Vitals  Enc Vitals Group     BP 06/18/15 1907 121/73 mmHg     Pulse Rate 06/18/15 1907 86     Resp 06/18/15 1907 16     Temp 06/18/15 1907 98 F (36.7 C)     Temp Source 06/18/15 1907 Oral     SpO2 06/18/15 1907 100 %     Weight 06/18/15 1907 167 lb (75.751 kg)  Height 06/18/15 1907 5\' 5"  (1.651 m)     Head Cir --      Peak Flow --      Pain Score 06/18/15 1909 5     Pain Loc --      Pain Edu? --      Excl. in GC? --     Constitutional: Alert and oriented. Well appearing and in no acute distress. Eyes: Conjunctivae are normal. PERRL. EOMI. Head: Atraumatic. Nose: No congestion/rhinnorhea. Mouth/Throat: Mucous membranes are moist.  Oropharynx non-erythematous. Neck: No stridor.   Cardiovascular: Normal rate, regular rhythm. Grossly normal heart sounds.  Good peripheral circulation. Respiratory: Normal respiratory effort.  No retractions. Lungs CTAB. Gastrointestinal: Soft and mildly tender to palpation right upper quadrant and lower abdomen without rebound or  guarding. No distention. No abdominal bruits. No CVA tenderness. Rectal: No fecal impaction noted. Tiny hard pellets of stool felt. Musculoskeletal: No lower extremity tenderness nor edema.  No joint effusions. Neurologic:  Normal speech and language. No gross focal neurologic deficits are appreciated. No gait instability. Skin:  Skin is warm, dry and intact. No rash noted. Psychiatric: Mood and affect are normal. Speech and behavior are normal.  ____________________________________________   LABS (all labs ordered are listed, but only abnormal results are displayed)  Labs Reviewed  URINALYSIS COMPLETEWITH MICROSCOPIC ([facility] ONLY) - Abnormal; Notable for the following:    Color, Urine STRAW (*)    APPearance CLEAR (*)    Bacteria, UA RARE (*)    Squamous Epithelial / LPF 0-5 (*)    All other components within normal limits  COMPREHENSIVE METABOLIC PANEL - Abnormal; Notable for the following:    AST 60 (*)    ALT 72 (*)    Total Bilirubin 0.2 (*)    All other components within normal limits  CBC WITH DIFFERENTIAL/PLATELET - Abnormal; Notable for the following:    WBC 13.2 (*)    RDW 14.8 (*)    Neutro Abs 7.5 (*)    Lymphs Abs 4.0 (*)    Monocytes Absolute 1.1 (*)    All other components within normal limits  LIPASE, BLOOD  CBC WITH DIFFERENTIAL/PLATELET  URINALYSIS COMPLETEWITH MICROSCOPIC ([facility] ONLY)  PREGNANCY, URINE  POC URINE PREG, ED  POCT PREGNANCY, URINE   ____________________________________________  EKG  None ____________________________________________  RADIOLOGY  Ultrasound abdomen limited RUQ and turbid per Dr. Karie KirksBloomer: Normal limited abdominal sonogram.  Abdominal series (view by me, interpreted per Dr. Karie KirksBloomer): Normal chest.  Moderate to large amount of retained large bowel stool, normal bowel gas pattern. ____________________________________________   PROCEDURES  Procedure(s) performed:    ------------------------------------------------------------------------------------------------------------------- Fecal Disimpaction Procedure Note:  Performed by me:  Patient placed in the lateral recumbent position with knees drawn towards chest. Small amount of hard brown stool removed. No complications during procedure.   ------------------------------------------------------------------------------------------------------------------   Critical Care performed: No  ____________________________________________   INITIAL IMPRESSION / ASSESSMENT AND PLAN / ED COURSE  Pertinent labs & imaging results that were available during my care of the patient were reviewed by me and considered in my medical decision making (see chart for details).  24 year old female who returns to the ED for constipation, vomiting and abdominal pain. Noted patient has mildly elevated LFTs on this and prior visit; will obtain RUQ US. Will obtain abdominal xrays, IV fluids, IV antiemetic and administer enema.  ----------------------------------------- 4:07 AM on 06/19/2015 -----------------------------------------  Patient had several bowel movements and feels much better. Tolerating PO without emesis. Updated patient and mother of  imaging studies. Strict return precautions given. Both verbalize understanding and agree with plan of care. ____________________________________________   FINAL CLINICAL IMPRESSION(S) / ED DIAGNOSES  Final diagnoses:  Constipation, unspecified constipation type  Non-intractable vomiting with nausea, vomiting of unspecified type  Ileus (HCC)      Irean Hong, MD 06/19/15 [phone]

## 2015-06-19 NOTE — Discharge Instructions (Signed)
1. Take laxative as needed for bowel movements (Lactulose). 2. Return to the ER for worsening symptoms, persistent vomiting, fever, difficulty breathing or other concerns.  Constipation, Adult Constipation is when a person has fewer than three bowel movements a week, has difficulty having a bowel movement, or has stools that are dry, hard, or larger than normal. As people grow older, constipation is more common. A low-fiber diet, not taking in enough fluids, and taking certain medicines may make constipation worse.  CAUSES   Certain medicines, such as antidepressants, pain medicine, iron supplements, antacids, and water pills.   Certain diseases, such as diabetes, irritable bowel syndrome (IBS), thyroid disease, or depression.   Not drinking enough water.   Not eating enough fiber-rich foods.   Stress or travel.   Lack of physical activity or exercise.   Ignoring the urge to have a bowel movement.   Using laxatives too much.  SIGNS AND SYMPTOMS   Having fewer than three bowel movements a week.   Straining to have a bowel movement.   Having stools that are hard, dry, or larger than normal.   Feeling full or bloated.   Pain in the lower abdomen.   Not feeling relief after having a bowel movement.  DIAGNOSIS  Your health care provider will take a medical history and perform a physical exam. Further testing may be done for severe constipation. Some tests may include:  A barium enema X-ray to examine your rectum, colon, and, sometimes, your small intestine.   A sigmoidoscopy to examine your lower colon.   A colonoscopy to examine your entire colon. TREATMENT  Treatment will depend on the severity of your constipation and what is causing it. Some dietary treatments include drinking more fluids and eating more fiber-rich foods. Lifestyle treatments may include regular exercise. If these diet and lifestyle recommendations do not help, your health care provider may  recommend taking over-the-counter laxative medicines to help you have bowel movements. Prescription medicines may be prescribed if over-the-counter medicines do not work.  HOME CARE INSTRUCTIONS   Eat foods that have a lot of fiber, such as fruits, vegetables, whole grains, and beans.  Limit foods high in fat and processed sugars, such as french fries, hamburgers, cookies, candies, and soda.   A fiber supplement may be added to your diet if you cannot get enough fiber from foods.   Drink enough fluids to keep your urine clear or pale yellow.   Exercise regularly or as directed by your health care provider.   Go to the restroom when you have the urge to go. Do not hold it.   Only take over-the-counter or prescription medicines as directed by your health care provider. Do not take other medicines for constipation without talking to your health care provider first.  SEEK IMMEDIATE MEDICAL CARE IF:   You have bright red blood in your stool.   Your constipation lasts for more than 4 days or gets worse.   You have abdominal or rectal pain.   You have thin, pencil-like stools.   You have unexplained weight loss. MAKE SURE YOU:   Understand these instructions.  Will watch your condition.  Will get help right away if you are not doing well or get worse.   This information is not intended to replace advice given to you by your health care provider. Make sure you discuss any questions you have with your health care provider.   Document Released: 04/25/2004 Document Revised: 08/18/2014 Document Reviewed: 05/09/2013 Elsevier Interactive  Patient Education 2016 Elsevier Inc.  Ileus  Ileus is a condition in which the intestines, also called the bowels, stop working and moving correctly. If the intestines stop working, food cannot pass through to get digested. The intestines are hollow organs that digest food after the food leaves the stomach. These organs are long, muscular tubes  that connect the stomach to the rectum. When ileus occurs, the muscular contractions that cause food to move through the intestines stop happening as they normally would. Ileus can occur for various reasons. This condition is a serious problem that usually requires hospitalization. It can cause symptoms such as nausea, abdominal pain, and bloating. Ileus can last from a few hours to a few days. If the intestines stop working because of a blockage, that is a different condition that is called a bowel obstruction. CAUSES This condition may be caused by:  Surgery on the abdomen.  An infection or inflammation in the abdomen. This includes inflammation of the lining of the abdomen (peritonitis).  Infection or inflammation in other parts of the body, such as pneumonia or pancreatitis.  Passage of gallstones or kidney stones.  Damage to the nerves or blood vessels that go to the intestines.  A collection of blood within the abdominal cavity.  Imbalance in the salts in the blood (electrolytes).  Injury to the brain or spinal cord.  Medicines. Many medicines, including strong pain medicines, can cause ileus or make it worse. SYMPTOMS Symptoms of this condition include:  Bloating of the abdomen.  Pain or discomfort in the abdomen.  Poor appetite.  Nausea and vomiting.  Lack of normal bowel sounds, such as "growling" in the stomach. DIAGNOSIS This condition may be diagnosed with:  A physical exam and medical history.  X-rays or a CT scan of the abdomen. You may also have other tests to help find the cause of the condition. TREATMENT Treatment for this condition may include:  Resting the intestines until they start to work again. This is often done by:  Stopping oral intake of food and drink. You will be given fluid through an IV tube to prevent dehydration.  Placing a small tube (nasogastric tube or NG tube) that is passed through your nose and into your stomach. The tube is  attached to a suction device and keeps the stomach emptied out. This allows the bowels to rest and also helps to reduce nausea and vomiting.  Correcting any electrolyte imbalance by giving supplements in the IV fluid.  Stopping any medicines that might make ileus worse.  Treating any condition that may have caused ileus. HOME CARE INSTRUCTIONS  Follow instructions from your health care provider about diet and fluid intake. Usually, you will be told to:  Drink plenty of clear fluids.  Avoid alcohol.  Avoid caffeine.  Eat a bland diet.  Get plenty of rest. Return to your normal activities as told by your health care provider.  Take over-the-counter and prescription medicines only as told by your health care provider.  Keep all follow-up visits as told by your health care provider. This is important. SEEK MEDICAL CARE IF:  You have nausea, vomiting, or abdominal discomfort.  You have a fever. SEEK IMMEDIATE MEDICAL CARE IF:  You have severe abdominal pain or bloating.  You cannot eat or drink without vomiting.   This information is not intended to replace advice given to you by your health care provider. Make sure you discuss any questions you have with your health care provider.  Document Released: 07/31/2003 Document Revised: 04/18/2015 Document Reviewed: 09/21/2014 Elsevier Interactive Patient Education 2016 Elsevier Inc.  Nausea and Vomiting Nausea is a sick feeling that often comes before throwing up (vomiting). Vomiting is a reflex where stomach contents come out of your mouth. Vomiting can cause severe loss of body fluids (dehydration). Children and elderly adults can become dehydrated quickly, especially if they also have diarrhea. Nausea and vomiting are symptoms of a condition or disease. It is important to find the cause of your symptoms. CAUSES   Direct irritation of the stomach lining. This irritation can result from increased acid production (gastroesophageal  reflux disease), infection, food poisoning, taking certain medicines (such as nonsteroidal anti-inflammatory drugs), alcohol use, or tobacco use.  Signals from the brain.These signals could be caused by a headache, heat exposure, an inner ear disturbance, increased pressure in the brain from injury, infection, a tumor, or a concussion, pain, emotional stimulus, or metabolic problems.  An obstruction in the gastrointestinal tract (bowel obstruction).  Illnesses such as diabetes, hepatitis, gallbladder problems, appendicitis, kidney problems, cancer, sepsis, atypical symptoms of a heart attack, or eating disorders.  Medical treatments such as chemotherapy and radiation.  Receiving medicine that makes you sleep (general anesthetic) during surgery. DIAGNOSIS Your caregiver may ask for tests to be done if the problems do not improve after a few days. Tests may also be done if symptoms are severe or if the reason for the nausea and vomiting is not clear. Tests may include:  Urine tests.  Blood tests.  Stool tests.  Cultures (to look for evidence of infection).  X-rays or other imaging studies. Test results can help your caregiver make decisions about treatment or the need for additional tests. TREATMENT You need to stay well hydrated. Drink frequently but in small amounts.You may wish to drink water, sports drinks, clear broth, or eat frozen ice pops or gelatin dessert to help stay hydrated.When you eat, eating slowly may help prevent nausea.There are also some antinausea medicines that may help prevent nausea. HOME CARE INSTRUCTIONS   Take all medicine as directed by your caregiver.  If you do not have an appetite, do not force yourself to eat. However, you must continue to drink fluids.  If you have an appetite, eat a normal diet unless your caregiver tells you differently.  Eat a variety of complex carbohydrates (rice, wheat, potatoes, bread), lean meats, yogurt, fruits, and  vegetables.  Avoid high-fat foods because they are more difficult to digest.  Drink enough water and fluids to keep your urine clear or pale yellow.  If you are dehydrated, ask your caregiver for specific rehydration instructions. Signs of dehydration may include:  Severe thirst.  Dry lips and mouth.  Dizziness.  Dark urine.  Decreasing urine frequency and amount.  Confusion.  Rapid breathing or pulse. SEEK IMMEDIATE MEDICAL CARE IF:   You have blood or brown flecks (like coffee grounds) in your vomit.  You have black or bloody stools.  You have a severe headache or stiff neck.  You are confused.  You have severe abdominal pain.  You have chest pain or trouble breathing.  You do not urinate at least once every 8 hours.  You develop cold or clammy skin.  You continue to vomit for longer than 24 to 48 hours.  You have a fever. MAKE SURE YOU:   Understand these instructions.  Will watch your condition.  Will get help right away if you are not doing well or get worse.  This information is not intended to replace advice given to you by your health care provider. Make sure you discuss any questions you have with your health care provider.   Document Released: 07/28/2005 Document Revised: 10/20/2011 Document Reviewed: 12/25/2010 Elsevier Interactive Patient Education 2016 Elsevier Inc.  Abdominal Pain, Adult Many things can cause abdominal pain. Usually, abdominal pain is not caused by a disease and will improve without treatment. It can often be observed and treated at home. Your health care provider will do a physical exam and possibly order blood tests and X-rays to help determine the seriousness of your pain. However, in many cases, more time must pass before a clear cause of the pain can be found. Before that point, your health care provider may not know if you need more testing or further treatment. HOME CARE INSTRUCTIONS Monitor your abdominal pain for any  changes. The following actions may help to alleviate any discomfort you are experiencing:  Only take over-the-counter or prescription medicines as directed by your health care provider.  Do not take laxatives unless directed to do so by your health care provider.  Try a clear liquid diet (broth, tea, or water) as directed by your health care provider. Slowly move to a bland diet as tolerated. SEEK MEDICAL CARE IF:  You have unexplained abdominal pain.  You have abdominal pain associated with nausea or diarrhea.  You have pain when you urinate or have a bowel movement.  You experience abdominal pain that wakes you in the night.  You have abdominal pain that is worsened or improved by eating food.  You have abdominal pain that is worsened with eating fatty foods.  You have a fever. SEEK IMMEDIATE MEDICAL CARE IF:  Your pain does not go away within 2 hours.  You keep throwing up (vomiting).  Your pain is felt only in portions of the abdomen, such as the right side or the left lower portion of the abdomen.  You pass bloody or black tarry stools. MAKE SURE YOU:  Understand these instructions.  Will watch your condition.  Will get help right away if you are not doing well or get worse.   This information is not intended to replace advice given to you by your health care provider. Make sure you discuss any questions you have with your health care provider.   Document Released: 05/07/2005 Document Revised: 04/18/2015 Document Reviewed: 04/06/2013 Elsevier Interactive Patient Education [employer]2016 [employer] Inc[employer]

## 2015-06-22 ENCOUNTER — Emergency Department
Admission: EM | Admit: 2015-06-22 | Discharge: 2015-06-22 | Disposition: A | Discharge status: Home / Self Care | Payer: 59 | Attending: Emergency Medicine | Admitting: Emergency Medicine

## 2015-06-22 ENCOUNTER — Encounter: Payer: Self-pay | Admitting: Emergency Medicine

## 2015-06-22 ENCOUNTER — Emergency Department: Payer: 59

## 2015-06-22 DIAGNOSIS — Z87891 Personal history of nicotine dependence: Secondary | ICD-10-CM | POA: Diagnosis not present

## 2015-06-22 DIAGNOSIS — M545 Low back pain: Secondary | ICD-10-CM | POA: Diagnosis not present

## 2015-06-22 DIAGNOSIS — Z3202 Encounter for pregnancy test, result negative: Secondary | ICD-10-CM | POA: Diagnosis not present

## 2015-06-22 DIAGNOSIS — G8929 Other chronic pain: Secondary | ICD-10-CM | POA: Diagnosis not present

## 2015-06-22 DIAGNOSIS — Z79899 Other long term (current) drug therapy: Secondary | ICD-10-CM | POA: Insufficient documentation

## 2015-06-22 DIAGNOSIS — K5903 Drug induced constipation: Secondary | ICD-10-CM | POA: Insufficient documentation

## 2015-06-22 DIAGNOSIS — R112 Nausea with vomiting, unspecified: Secondary | ICD-10-CM | POA: Insufficient documentation

## 2015-06-22 DIAGNOSIS — R109 Unspecified abdominal pain: Secondary | ICD-10-CM

## 2015-06-22 DIAGNOSIS — K5900 Constipation, unspecified: Secondary | ICD-10-CM | POA: Diagnosis present

## 2015-06-22 LAB — COMPREHENSIVE METABOLIC PANEL
ALK PHOS: 134 U/L — AB (ref 38–126)
ALT: 83 U/L — ABNORMAL HIGH (ref 14–54)
ANION GAP: 5 (ref 5–15)
AST: 68 U/L — ABNORMAL HIGH (ref 15–41)
Albumin: 4.1 g/dL (ref 3.5–5.0)
BILIRUBIN TOTAL: 0.6 mg/dL (ref 0.3–1.2)
BUN: 9 mg/dL (ref 6–20)
CALCIUM: 9.3 mg/dL (ref 8.9–10.3)
CO2: 28 mmol/L (ref 22–32)
Chloride: 104 mmol/L (ref 101–111)
Creatinine, Ser: 0.74 mg/dL (ref 0.44–1.00)
GFR calc Af Amer: >60 mL/min (ref >60)
GLUCOSE: 105 mg/dL — AB (ref 65–99)
Potassium: 4.2 mmol/L (ref 3.5–5.1)
Sodium: 137 mmol/L (ref 135–145)
TOTAL PROTEIN: 7.8 g/dL (ref 6.5–8.1)

## 2015-06-22 LAB — CBC
HEMATOCRIT: 47.3 % — AB (ref 35.0–47.0)
HEMOGLOBIN: 15.1 g/dL (ref 12.0–16.0)
MCH: 27.4 pg (ref 26.0–34.0)
MCHC: 31.9 g/dL — AB (ref 32.0–36.0)
MCV: 85.8 fL (ref 80.0–100.0)
Platelets: 304 10*3/uL (ref 150–440)
RBC: 5.52 MIL/uL — ABNORMAL HIGH (ref 3.80–5.20)
RDW: 14.8 % — AB (ref 11.5–14.5)
WBC: 12.3 10*3/uL — AB (ref 3.6–11.0)

## 2015-06-22 LAB — PREGNANCY, URINE: PREG TEST UR: NEGATIVE

## 2015-06-22 LAB — LIPASE, BLOOD: Lipase: 34 U/L (ref 11–51)

## 2015-06-22 LAB — URINALYSIS COMPLETE WITH MICROSCOPIC (ARMC ONLY)
Bilirubin Urine: NEGATIVE
GLUCOSE, UA: NEGATIVE mg/dL
HGB URINE DIPSTICK: NEGATIVE
KETONES UR: NEGATIVE mg/dL
LEUKOCYTES UA: NEGATIVE
NITRITE: NEGATIVE
Protein, ur: NEGATIVE mg/dL
RBC / HPF: NONE SEEN RBC/hpf (ref 0–5)
SPECIFIC GRAVITY, URINE: 1.008 (ref 1.005–1.030)
pH: 8 (ref 5.0–8.0)

## 2015-06-22 MED ORDER — PEG 3350-KCL-NABCB-NACL-NASULF 236 G PO SOLR: 4.0000 L | Freq: Once | ORAL | Status: AC

## 2015-06-22 NOTE — ED Notes (Signed)
Seen in ED 11/4 for constipation.  Returned to ED 11/7 for same.  Still unable to move bowels and vomiting.  Last BM 11/5.

## 2015-06-22 NOTE — Discharge Instructions (Signed)
Abdominal Pain, Adult You are constipated and this is likely because of your pain medications. I strongly advise that you work with your prescriber to get off of narcotic pain medications. You have declined enema or further intervention from us here, which is not unreasonable but it does limit our ability to take care of you. If you change your mind, or you have persistent vomiting abdominal pain that worsens fever chills bleeding or you feel worse in any way please return to the emergency department. Use the GoLYTELY as prescribed, you do not need to take it all at once you may take some and see if you have a result and then take more as needed. We do recommend that he use MiraLAX every day after you have a bowel movement and that you've increased the fiber in your diet Many things can cause abdominal pain. Usually, abdominal pain is not caused by a disease and will improve without treatment. It can often be observed and treated at home. Your health care provider will do a physical exam and possibly order blood tests and X-rays to help determine the seriousness of your pain. However, in many cases, more time must pass before a clear cause of the pain can be found. Before that point, your health care provider may not know if you need more testing or further treatment. HOME CARE INSTRUCTIONS Monitor your abdominal pain for any changes. The following actions may help to alleviate any discomfort you are experiencing:  Only take over-the-counter or prescription medicines as directed by your health care provider.  Do not take laxatives unless directed to do so by your health care provider.  Try a clear liquid diet (broth, tea, or water) as directed by your health care provider. Slowly move to a bland diet as tolerated. SEEK MEDICAL CARE IF:  You have unexplained abdominal pain.  You have abdominal pain associated with nausea or diarrhea.  You have pain when you urinate or have a bowel movement.  You  experience abdominal pain that wakes you in the night.  You have abdominal pain that is worsened or improved by eating food.  You have abdominal pain that is worsened with eating fatty foods.  You have a fever. SEEK IMMEDIATE MEDICAL CARE IF:  Your pain does not go away within 2 hours.  You keep throwing up (vomiting).  Your pain is felt only in portions of the abdomen, such as the right side or the left lower portion of the abdomen.  You pass bloody or black tarry stools. MAKE SURE YOU:  Understand these instructions.  Will watch your condition.  Will get help right away if you are not doing well or get worse.   This information is not intended to replace advice given to you by your health care provider. Make sure you discuss any questions you have with your health care provider.   Document Released: 05/07/2005 Document Revised: 04/18/2015 Document Reviewed: 04/06/2013 Elsevier Interactive Patient Education [employer] Inc.

## 2015-06-22 NOTE — ED Provider Notes (Addendum)
----------------------------------------- 4:53 PM on 06/22/2015 -----------------------------------------  [facility] Emergency Department Provider Note  ____________________________________________   I have reviewed the triage vital signs and the nursing notes.   HISTORY  Chief Complaint Constipation    HPI Belinda Solis is a 24 y.o. female who returns to the emergency room complaining of constipation. She is also been nauseated and had some vomiting. Patient has had this going on now forover a week. Patient is on chronic narcotics as chronic constipation for her low back pain. She usually takes fiber in the constipation gets better however it has not been helping. Patient has been seen here now twice for the same problem. She states she cannot go off her narcotics because she needs them. She did have a CT scan which was questionable for mesenteric adenitis but otherwise negative. No obstruction or other intra-abdominal pathology was noted. She then returned with constipation symptoms and at the age of 68 had a disimpaction for seizure performed which seem to help. However since going home she still having bowel movements and still feels nauseated. 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  Past Medical History  Diagnosis Date  . Anxiety   . Depression     There are no active problems to display for this patient.   History reviewed. No pertinent past surgical history.  Current Outpatient Rx  Name  Route  Sig  Dispense  Refill  . busPIRone (BUSPAR) 10 MG tablet   Oral   Take 10 mg by mouth 3 (three) times daily.         . citalopram (CELEXA) 10 MG tablet   Oral   Take 10 mg by mouth daily.         . cyclobenzaprine (FLEXERIL) 10 MG tablet   Oral  Take 10 mg by mouth 2 (two) times daily.         Marland Kitchen lactulose (CHRONULAC) 10 GM/15ML solution   Oral   Take 30 mLs (20 g total) by mouth daily as needed for mild constipation.   120 mL   0   . ondansetron (ZOFRAN) 4 MG tablet   Oral   Take 1 tablet (4 mg total) by mouth daily as needed for nausea or vomiting.   20 tablet   1   . oxyCODONE-acetaminophen (PERCOCET) 10-325 MG tablet   Oral   Take 1 tablet by mouth 2 (two) times daily as needed for pain.         . polyethylene glycol powder (GLYCOLAX/MIRALAX) powder      Mix with 8oz water and drink daily.   255 g   0   . traMADol (ULTRAM) 50 MG tablet   Oral   Take 1 tablet (50 mg total) by mouth every 6 (six) hours as needed.   20 tablet   0     Allergies Tape  No family history on file.  Social History Social History  Substance Use Topics  . Smoking status: Former Games developer  . Smokeless tobacco: None  . Alcohol Use: Yes     Comment: occ    Review of Systems Constitutional: No fever/chills Eyes: No visual changes. ENT: No sore throat. No stiff neck no neck pain Cardiovascular: Denies chest pain. Respiratory: Denies shortness of breath. Gastrointestinal:   See history of present  illness regarding vomiting and constipation . Genitourinary: Negative for dysuria. Musculoskeletal: Negative lower extremity swelling Skin: Negative for rash. Neurological: Negative for headaches, focal weakness or numbness. 10-point ROS otherwise negative.  ____________________________________________   PHYSICAL EXAM:  VITAL SIGNS: ED Triage Vitals  Enc Vitals Group     BP 06/22/15 1401 110/87 mmHg     Pulse Rate 06/22/15 1401 121     Resp 06/22/15 1401 18     Temp 06/22/15 1401 97.8 F (36.6 C)     Temp Source 06/22/15 1401 Oral     SpO2 06/22/15 1401 96 %     Weight 06/22/15 1401 165 lb (74.844 kg)     Height 06/22/15 1401 5\' 5"  (1.651 m)     Head Cir --      Peak Flow --      Pain Score 06/22/15 1402 4     Pain  Loc --      Pain Edu? --      Excl. in GC? --     Constitutional: Alert and oriented. Well appearing and in no acute distress. Eyes: Conjunctivae are normal. PERRL. EOMI. Head: Atraumatic. Nose: No congestion/rhinnorhea. Mouth/Throat: Mucous membranes are moist.  Oropharynx non-erythematous. Neck: No stridor.   Nontender with no meningismus Cardiovascular: Normal rate, regular rhythm. Grossly normal heart sounds.  Good peripheral circulation. Respiratory: Normal respiratory effort.  No retractions. Lungs CTAB. Abdominal: Soft and nontender. No distention. No guarding no rebound Back:  There is no focal tenderness or step off there is no midline tenderness there are no lesions noted. there is no CVA tenderness Musculoskeletal: No lower extremity tenderness. No joint effusions, no DVT signs strong distal pulses no edema Neurologic:  Normal speech and language. No gross focal neurologic deficits are appreciated. There is no saddle anesthesia, there is strong distal reflexes and normal strength and sensation to lower extremity Skin:  Skin is warm, dry and intact. No rash noted. Psychiatric: Mood and affect are normal. Speech and behavior are normal.  ____________________________________________   LABS (all labs ordered are listed, but only abnormal results are displayed)  Labs Reviewed  COMPREHENSIVE METABOLIC PANEL - Abnormal; Notable for the following:    Glucose, Bld 105 (*)    AST 68 (*)    ALT 83 (*)    Alkaline Phosphatase 134 (*)    All other components within normal limits  CBC - Abnormal; Notable for the following:    WBC 12.3 (*)    RBC 5.52 (*)    HCT 47.3 (*)    MCHC 31.9 (*)    RDW 14.8 (*)    All other components within normal limits  URINALYSIS COMPLETEWITH MICROSCOPIC ([facility] ONLY) - Abnormal; Notable for the following:    Color, Urine YELLOW (*)    APPearance HAZY (*)    Bacteria, UA MANY (*)    Squamous Epithelial / LPF 0-5 (*)    All other components within  normal limits  LIPASE, BLOOD  PREGNANCY, URINE   ____________________________________________  EKG  I personally interpreted any EKGs ordered by me or triage  ____________________________________________  RADIOLOGY  I reviewed any imaging ordered by me or triage that were performed during my shift ____________________________________________   PROCEDURES  Procedure(s) performed: None  Critical Care performed: None  ____________________________________________   INITIAL IMPRESSION / ASSESSMENT AND PLAN / ED COURSE  Pertinent labs & imaging results that were available during my care of the patient were reviewed by me and considered in my medical decision making (see  chart for details).  Narcotic dependent patient with complaints of constipation here, despite symptoms now for well over a week, her blood work is reassuring. She has a slight elevation in her transaminases but nothing significant and she denies taking excess Tylenol, do not think that her constipation is caused by an acute liver pathology in her liver exam is reassuring during the CT recently as well as her right upper quadrant exam today is reassuring. She has had a mildly elevated white count which is improving, she is not anemic, and her urinalysis is reassuring. She is not pregnant. There is no evidence of cauda equina syndrome she is not incontinent of bowel or bladder. Her lipase is negative. I will obtain an abdominal series given the fact that I do not wish to irradiate her again for the same problem with no acute pathology noted on exam Will offer her an enema if all else appears normal.   ----------------------------------------- 5:34 PM on 06/22/2015 -----------------------------------------  Patient very well-appearing at this time, no evidence of vomiting, I have offered her further imaging but she declines at this time. I also offered her an enema but she declines at this time. She states that she needs  a note for work. I have advised her to stop taking narcotic pain medication if at all possible but she states she cannot because if she does she know she'll need surgery. I do think that this history is secondary to chronic narcotic use. She has had no incontinence of bowel or bladder. There is nothing to suggest that this is an acute neurologic problems A. Serial abdominal exams betray no evidence of acute intra-abdominal pathology and I do not think a repeat CT scan is warranted. Blood work is reassuring, her chief complaint is constipation she is declining further intervention and we will send her home with GoLYTELY to see if we can help without problem. Return precautions and follow-up are given and understood and patient understands she can come back any time if she sees fit. ____________________________________________   FINAL CLINICAL IMPRESSION(S) / ED DIAGNOSES  Final diagnoses:  Abdominal pain     Jeanmarie Plant, MD 06/22/15 1659  Jeanmarie Plant, MD 06/22/15 1735

## 2016-11-29 IMAGING — CR DG CERVICAL SPINE COMPLETE 4+V
5 series · 5 of 5 positions shown · non-contrast
Comparison: None.

CLINICAL DATA: Fall off chair, landing on concrete on right side of
arm and shoulder. Right-sided neck pain. Initial encounter.

EXAM:
CERVICAL SPINE  4+ VIEWS

[c-spine lat]
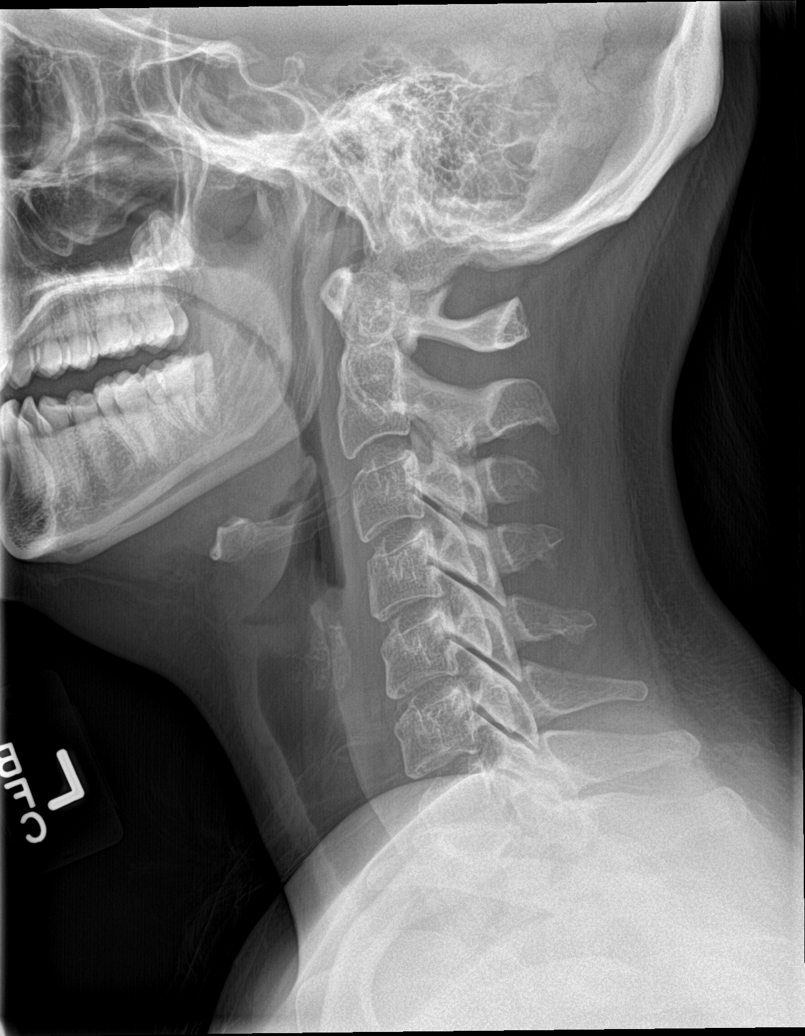

[c-spine obl (1 of 2)]
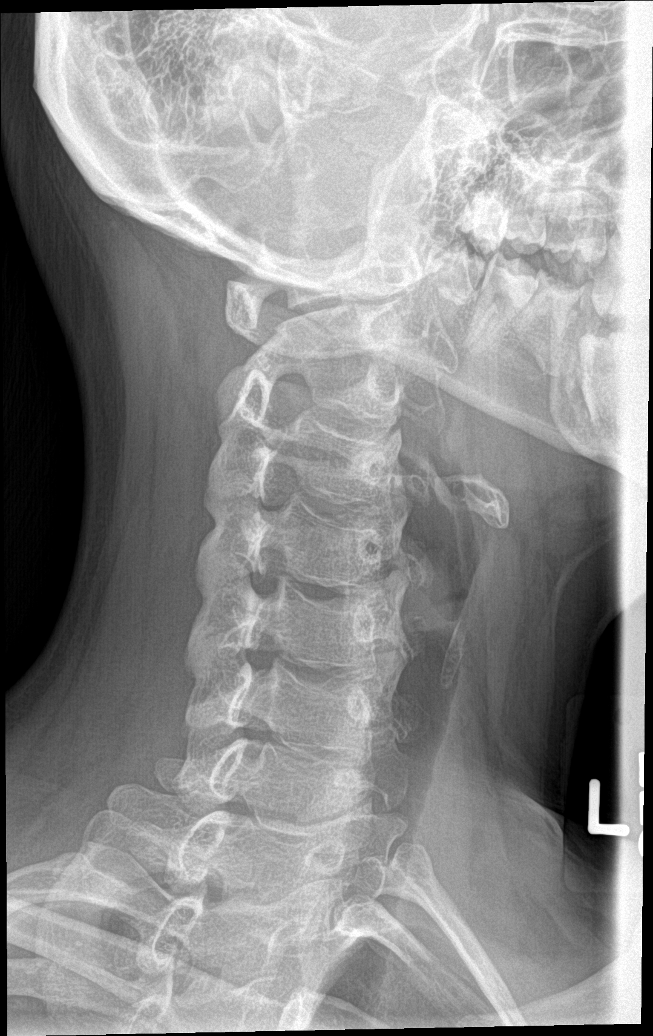

[c-spine obl (2 of 2)]
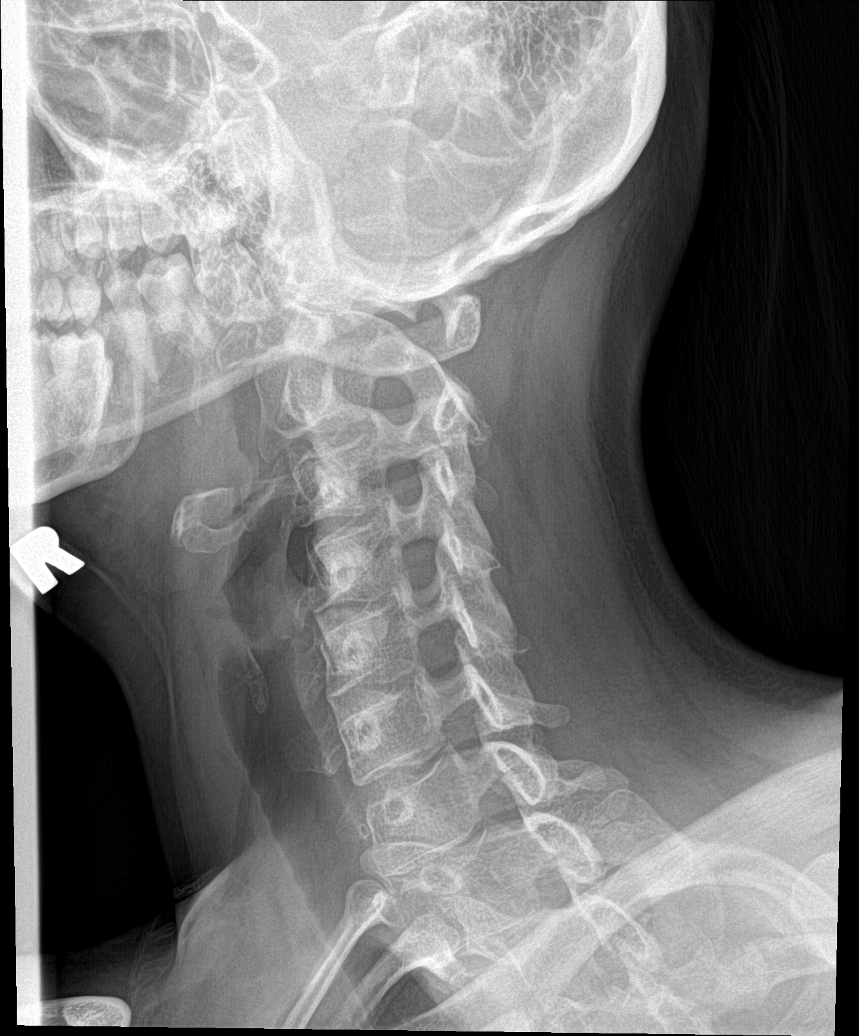

[c-spine ap]
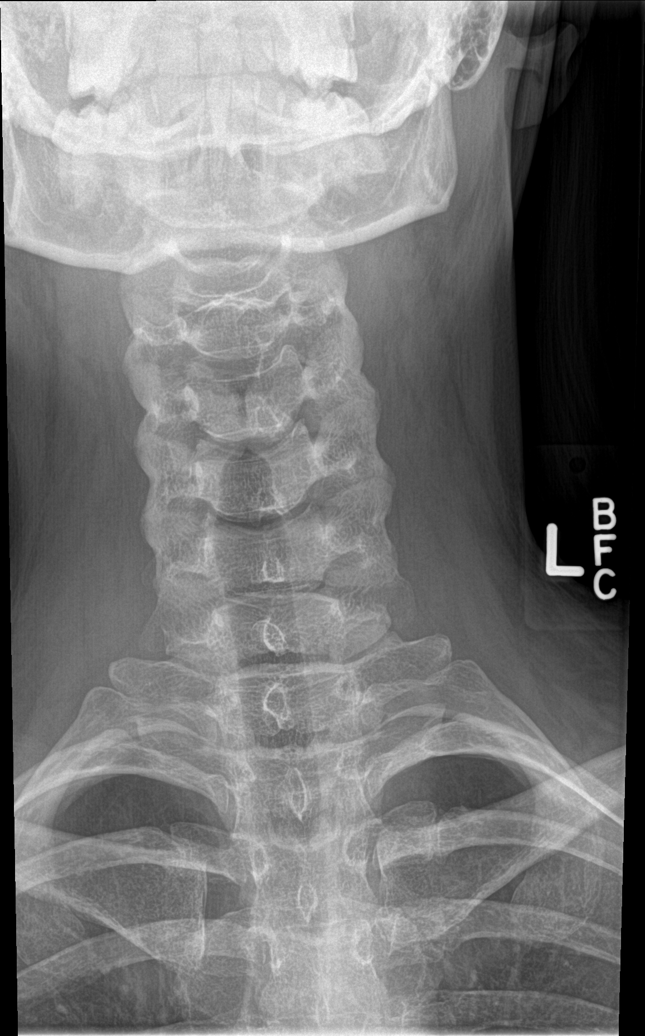

[c-spine open mouth]
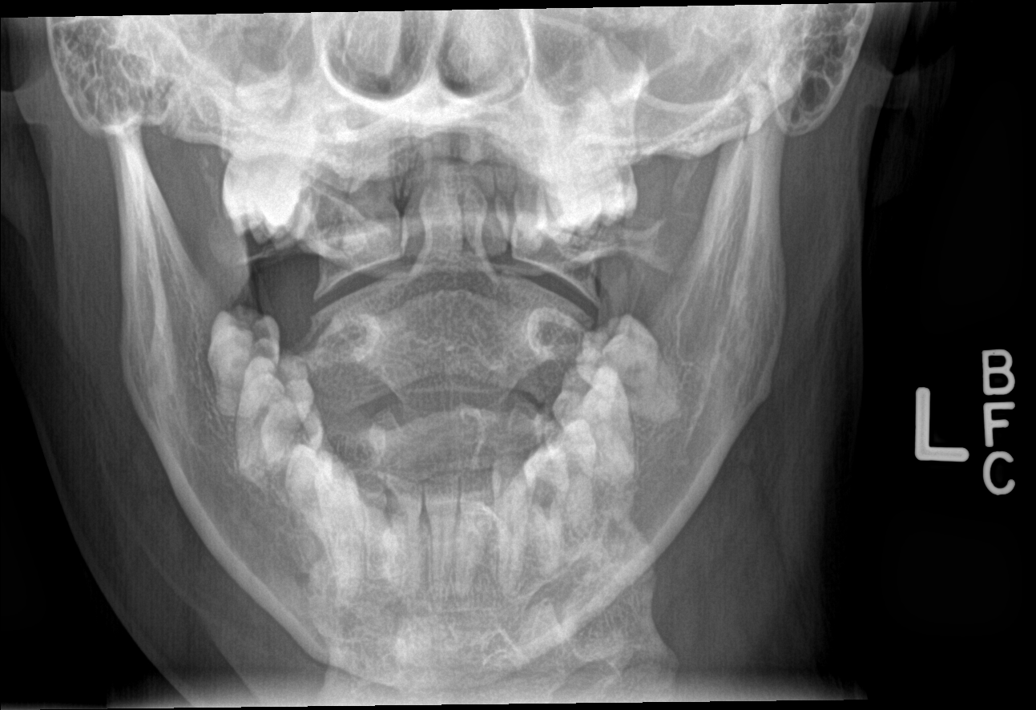

[5 of 5 positions shown; findings below may reference images not displayed]

FINDINGS: There is mild straightening of the normal cervical lordosis, which
may be positional or due to muscle spasm. There is no listhesis.
Prevertebral soft tissues are within normal limits. Vertebral body
heights and intervertebral disc space heights are preserved. No
cervical spine fracture is identified. No lytic or blastic osseous
lesion or radiopaque foreign body. Visualized lung apices are clear.
IMPRESSION: No acute osseous abnormality identified.
